# Patient Record
Sex: Male | Born: 1995 | Race: White | Hispanic: No | Marital: Single
Health system: Southern US, Community
[De-identification: ages and names within clinical notes are randomized; demographics above are authoritative.]

---
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
## 2019-05-03 ENCOUNTER — Other Ambulatory Visit: Payer: Self-pay

## 2019-05-03 ENCOUNTER — Encounter (HOSPITAL_COMMUNITY): Payer: Self-pay | Admitting: Emergency Medicine

## 2019-05-03 ENCOUNTER — Ambulatory Visit (HOSPITAL_COMMUNITY): Admission: EM | Admit: 2019-05-03 | Discharge: 2019-05-03 | Disposition: A | Discharge status: Home / Self Care

## 2019-05-03 DIAGNOSIS — Z111 Encounter for screening for respiratory tuberculosis: Secondary | ICD-10-CM | POA: Diagnosis not present

## 2019-05-03 NOTE — ED Triage Notes (Signed)
Patient is in department for TB skin test to be read.  Have reviewed documents in Epic, unable to find documentation of where skin test placed on forearm, which forearm.  Explained to patient this form is neccessary, but agreed to writing a document that states both forearms have been evaluated and no induration noted to either forearm.  Patient had a band-aid to left forearm that patient removed while in department and indicated this was location of test.  Again, neither forearm showed any abnormality.    Provided patient with a note reflecting this finding.  Explained that this may not be acceptable to other party.  Patient understands.

## 2020-07-02 ENCOUNTER — Ambulatory Visit: Admitting: Family Medicine
